# Patient Record
Sex: Male | Born: 2000 | Race: Black or African American | Hispanic: No | Marital: Single | Smoking: Never smoker
Health system: Southern US, Community
[De-identification: ages and names within clinical notes are randomized; demographics above are authoritative.]

---

## 2021-12-06 ENCOUNTER — Emergency Department (HOSPITAL_COMMUNITY): Payer: Federal, State, Local not specified - PPO

## 2021-12-06 ENCOUNTER — Other Ambulatory Visit: Payer: Self-pay

## 2021-12-06 ENCOUNTER — Encounter (HOSPITAL_COMMUNITY): Payer: Self-pay

## 2021-12-06 ENCOUNTER — Emergency Department (HOSPITAL_COMMUNITY)
Admission: EM | Admit: 2021-12-06 | Discharge: 2021-12-07 | Disposition: A | Discharge status: Home / Self Care | Payer: Federal, State, Local not specified - PPO | Attending: Emergency Medicine | Admitting: Emergency Medicine

## 2021-12-06 DIAGNOSIS — R079 Chest pain, unspecified: Secondary | ICD-10-CM

## 2021-12-06 DIAGNOSIS — R001 Bradycardia, unspecified: Secondary | ICD-10-CM | POA: Diagnosis not present

## 2021-12-06 DIAGNOSIS — R0789 Other chest pain: Secondary | ICD-10-CM | POA: Diagnosis not present

## 2021-12-06 DIAGNOSIS — R1013 Epigastric pain: Secondary | ICD-10-CM | POA: Insufficient documentation

## 2021-12-06 LAB — CBC WITH DIFFERENTIAL/PLATELET
Abs Immature Granulocytes: 0.02 10*3/uL (ref 0.00–0.07)
Basophils Absolute: 0 10*3/uL (ref 0.0–0.1)
Basophils Relative: 1 %
Eosinophils Absolute: 0.2 10*3/uL (ref 0.0–0.5)
Eosinophils Relative: 2 %
HCT: 35.8 % — ABNORMAL LOW (ref 39.0–52.0)
Hemoglobin: 12 g/dL — ABNORMAL LOW (ref 13.0–17.0)
Immature Granulocytes: 0 %
Lymphocytes Relative: 21 %
Lymphs Abs: 1.8 10*3/uL (ref 0.7–4.0)
MCH: 29.9 pg (ref 26.0–34.0)
MCHC: 33.5 g/dL (ref 30.0–36.0)
MCV: 89.3 fL (ref 80.0–100.0)
Monocytes Absolute: 0.7 10*3/uL (ref 0.1–1.0)
Monocytes Relative: 8 %
Neutro Abs: 6.1 10*3/uL (ref 1.7–7.7)
Neutrophils Relative %: 68 %
Platelets: 240 10*3/uL (ref 150–400)
RBC: 4.01 MIL/uL — ABNORMAL LOW (ref 4.22–5.81)
RDW: 13 % (ref 11.5–15.5)
WBC: 8.8 10*3/uL (ref 4.0–10.5)
nRBC: 0 % (ref 0.0–0.2)

## 2021-12-06 MED ORDER — ALUM & MAG HYDROXIDE-SIMETH 200-200-20 MG/5ML PO SUSP
30.0000 mL | Freq: Once | ORAL | Status: AC
  Administered 2021-12-06: 30 mL via ORAL
  Filled 2021-12-06: qty 30

## 2021-12-06 MED ORDER — LIDOCAINE VISCOUS HCL 2 % MT SOLN
15.0000 mL | Freq: Once | OROMUCOSAL | Status: AC
  Administered 2021-12-06: 15 mL via ORAL
  Filled 2021-12-06: qty 15

## 2021-12-06 NOTE — ED Triage Notes (Signed)
Left chest pain that radiates to back and left shoulder. Also describes a sharp stabbing pain when taking a deep breath. Denies injury. Pain began while eating dinner.  ?

## 2021-12-06 NOTE — ED Provider Notes (Signed)
?[facility]-EMERGENCY DEPT ?Provider Note ? ? ?CSN: [id number] ?Arrival date & time: 12/06/21  2214 ? ?  ? ?History ? ?Chief Complaint  ?Patient presents with  ? Chest Pain  ? ? ?Kevin Cabrera is a 21 y.o. male without significant pmhx who presents to the ED with complaints of chest pain that started 1 hour PTA. Patient reports that he was sitting down watching TV and started to note pain to this left/central chest, sharp in nature, worse when supine, no alleviating factors. Denies nausea, vomiting, dyspnea, syncope, abdominal pain, leg pain/swelling, hemoptysis, recent surgery/trauma, recent long travel, hormone use, personal hx of cancer, or hx of DVT/PE.  ? ? ? ?HPI ? ?  ? ?Home Medications ?Prior to Admission medications   ?Not on File  ?   ? ?Allergies    ?Patient has no known allergies.   ? ?Review of Systems   ?Review of Systems  ?Constitutional:  Negative for chills and fever.  ?Respiratory:  Negative for cough and shortness of breath.   ?Cardiovascular:  Positive for chest pain. Negative for leg swelling.  ?Gastrointestinal:  Negative for abdominal pain, nausea and vomiting.  ?Neurological:  Negative for dizziness, syncope and light-headedness.  ?All other systems reviewed and are negative. ? ?Physical Exam ?Updated Vital Signs ?BP 118/79 (BP Location: Left Arm)   Pulse 65   Temp 98.1 ?F (36.7 ?C) (Oral)   Resp 16   Ht 6\' 1"  (1.854 m)   Wt 70.3 kg   SpO2 100%   BMI 20.45 kg/m?  ?Physical Exam ?Vitals and nursing note reviewed.  ?Constitutional:   ?   General: He is not in acute distress. ?   Appearance: He is well-developed. He is not toxic-appearing.  ?HENT:  ?   Head: Normocephalic and atraumatic.  ?Eyes:  ?   General:     ?   Right eye: No discharge.     ?   Left eye: No discharge.  ?   Conjunctiva/sclera: Conjunctivae normal.  ?Cardiovascular:  ?   Rate and Rhythm: Normal rate and regular rhythm.  ?   Pulses:     ?     Radial pulses are 2+ on the right side and 2+ on the left  side.  ?Pulmonary:  ?   Effort: No respiratory distress.  ?   Breath sounds: Normal breath sounds. No wheezing or rales.  ?Chest:  ?   Chest wall: Tenderness (anterior chest wall TTP centrally and to the left) present.  ?Abdominal:  ?   General: There is no distension.  ?   Palpations: Abdomen is soft.  ?   Tenderness: There is abdominal tenderness in the epigastric area. There is no guarding or rebound. Negative signs include Murphy's sign.  ?Musculoskeletal:  ?   Cervical back: Neck supple.  ?   Right lower leg: No tenderness. No edema.  ?   Left lower leg: No tenderness. No edema.  ?Skin: ?   General: Skin is warm and dry.  ?Neurological:  ?   Mental Status: He is alert.  ?   Comments: Clear speech.   ?Psychiatric:     ?   Behavior: Behavior normal.  ? ? ?ED Results / Procedures / Treatments   ?Labs ?(all labs ordered are listed, but only abnormal results are displayed) ?Labs Reviewed  ?COMPREHENSIVE METABOLIC PANEL - Abnormal; Notable for the following components:  ?    Result Value  ? Glucose, Bld 101 (*)   ? Total Protein  6.4 (*)   ? Anion gap 3 (*)   ? All other components within normal limits  ?CBC WITH DIFFERENTIAL/PLATELET - Abnormal; Notable for the following components:  ? RBC 4.01 (*)   ? Hemoglobin 12.0 (*)   ? HCT 35.8 (*)   ? All other components within normal limits  ?LIPASE, BLOOD  ? ? ?EKG ?None ? ?Radiology ?DG Chest 2 View ? ?Result Date: 12/06/2021 ?CLINICAL DATA:  Left chest pain radiating to back and left shoulder, pain with inspiration EXAM: CHEST - 2 VIEW COMPARISON:  None. FINDINGS: Frontal and lateral views of the chest demonstrate an unremarkable cardiac silhouette. No acute airspace disease, effusion, or pneumothorax. No acute bony abnormality. IMPRESSION: 1. No acute intrathoracic process. Electronically Signed   By: Sharlet SalinaMichael  Brown M.D.   On: 12/06/2021 23:09   ? ?Procedures ?Procedures  ? ? ?Medications Ordered in ED ?Medications  ?alum & mag hydroxide-simeth (MAALOX/MYLANTA)  200-200-20 MG/5ML suspension 30 mL (has no administration in time range)  ?  And  ?lidocaine (XYLOCAINE) 2 % viscous mouth solution 15 mL (has no administration in time range)  ? ? ?ED Course/ Medical Decision Making/ A&P ?  ?                        ?Medical Decision Making ?Amount and/or Complexity of Data Reviewed ?Labs: ordered. ?Radiology: ordered. ? ?Risk ?OTC drugs. ?Prescription drug management. ? ?Patient presents to the emergency department with chest pain. Patient nontoxic appearing, in no apparent distress, vitals without significant abnormality, mild intermittent bradycardia.  Chest wall and mild epigastric tenderness to palpation.  No peritoneal signs. ? ?DDX including but not limited to: ACS, pulmonary embolism, dissection, pneumothorax, pneumonia, arrhythmia, severe anemia, MSK, GERD, anxiety, abdominal process.  ? ?Additional history obtained:  ?Chart & nursing note reviewed.  ? ?EKG: Sinus bradycardia., no STEMI ? ?Lab Tests:  ?I reviewed & interpreted labs including:  ?CBC: Mild anemia, PCP recheck ?CMP: No critical electrolyte derangement.  LFTs within normal limits. ?Lipase: Within normal limits ? ?Imaging Studies ordered:  ?I ordered and viewed the following imaging, agree with radiologist impression:  1. No acute intrathoracic process.  ? ?ED Course:  ?I ordered medications including Gi cocktail & pepcid w/ improvement in sxs. EKG without acute ischemia, no significant CAD risk fractors, doubt ACS. Patient is low risk wells, PERC negative, doubt pulmonary embolism. Pain is not a tearing sensation, symmetric pulses, no widening of mediastinum on CXR, doubt dissection.  Repeat abdominal exam remains without peritoneal signs, I have a low suspicion for acute surgical process such as cholecystitis, perforation, or obstruction.  Given improvement with GI cocktail and Pepcid will provide prescriptions for Protonix and Carafate to go home with.  May have been GERD/PUD etiology of pain, however remains  possible musculoskeletal component with chest wall palpation. Supportive care @ home with close PCP Follow up. I discussed results, treatment plan, need for follow-up, and return precautions with the patient. Provided opportunity for questions, patient confirmed understanding and is in agreement with plan.  ? ? ?Portions of this note were generated with Scientist, clinical (histocompatibility and immunogenetics)Dragon dictation software. Dictation errors may occur despite best attempts at proofreading. ? ?Final Clinical Impression(s) / ED Diagnoses ?Final diagnoses:  ?Chest pain, unspecified type  ? ? ?Rx / DC Orders ?ED Discharge Orders   ? ?      Ordered  ?  pantoprazole (PROTONIX) 40 MG tablet  Daily       ? 12/07/21 0146  ?  sucralfate (CARAFATE) 1 GM/10ML suspension  3 times daily with meals & bedtime       ? 12/07/21 0146  ? ?  ?  ? ?  ? ? ?  ?Cherly Anderson, [place] ?12/07/21 3335 ? ?  ?Tilden Fossa, MD ?12/07/21 [phone] ? ?

## 2021-12-07 LAB — COMPREHENSIVE METABOLIC PANEL
ALT: 15 U/L (ref 0–44)
AST: 19 U/L (ref 15–41)
Albumin: 4 g/dL (ref 3.5–5.0)
Alkaline Phosphatase: 61 U/L (ref 38–126)
Anion gap: 3 — ABNORMAL LOW (ref 5–15)
BUN: 9 mg/dL (ref 6–20)
CO2: 28 mmol/L (ref 22–32)
Calcium: 9.1 mg/dL (ref 8.9–10.3)
Chloride: 107 mmol/L (ref 98–111)
Creatinine, Ser: 0.89 mg/dL (ref 0.61–1.24)
GFR, Estimated: >60 mL/min (ref >60)
Glucose, Bld: 101 mg/dL — ABNORMAL HIGH (ref 70–99)
Potassium: 4 mmol/L (ref 3.5–5.1)
Sodium: 138 mmol/L (ref 135–145)
Total Bilirubin: 0.6 mg/dL (ref 0.3–1.2)
Total Protein: 6.4 g/dL — ABNORMAL LOW (ref 6.5–8.1)

## 2021-12-07 LAB — LIPASE, BLOOD: Lipase: 34 U/L (ref 11–51)

## 2021-12-07 MED ORDER — SUCRALFATE 1 GM/10ML PO SUSP: 1.0000 g | Freq: Three times a day (TID) | ORAL | 0 refills | Status: AC

## 2021-12-07 MED ORDER — FAMOTIDINE 20 MG PO TABS
20.0000 mg | ORAL_TABLET | Freq: Once | ORAL | Status: AC
  Administered 2021-12-07: 20 mg via ORAL
  Filled 2021-12-07: qty 1

## 2021-12-07 MED ORDER — PANTOPRAZOLE SODIUM 40 MG PO TBEC: 40.0000 mg | DELAYED_RELEASE_TABLET | Freq: Every day | ORAL | 0 refills | Status: AC

## 2021-12-07 NOTE — Discharge Instructions (Addendum)
You were seen in the emergency department tonight for chest pain.  Your chest x-ray was normal.  Your labs show that your hemoglobin was mildly low please have this rechecked by your primary care provider.  Your EKG was reassuring.  We are sending you home with the following medications to help with your symptoms:  ?- Protonix- please take 1 tablet in the morning prior to any meals to help with stomach acidity/pain.  ?- Carafate- please take prior to each meal and prior to bedtime to help with stomach acidity/pain.  ? ?You may take Tylenol per over-the-counter dosing to help with pain as well. ?We have prescribed you new medication(s) today. Discuss the medications prescribed today with your pharmacist as they can have adverse effects and interactions with your other medicines including over the counter and prescribed medications. Seek medical evaluation if you start to experience new or abnormal symptoms after taking one of these medicines, seek care immediately if you start to experience difficulty breathing, feeling of your throat closing, facial swelling, or rash as these could be indications of a more serious allergic reaction ? ?Please follow attached diet guidelines.  ? ?Follow up with your primary care provider within 3 days for re-evaluation.  ?Return to the ER for new or worsening symptoms including but not limited to worsened pain, new pain, inability to keep fluids down, blood in vomit/stool, passing out, trouble breathing, or any other concerns.   ?

## 2023-04-30 IMAGING — CR DG CHEST 2V
2 series · 2 of 2 positions shown · non-contrast
Comparison: None.

CLINICAL DATA: Left chest pain radiating to back and left shoulder,
pain with inspiration

EXAM:
CHEST - 2 VIEW

[w chest pa]
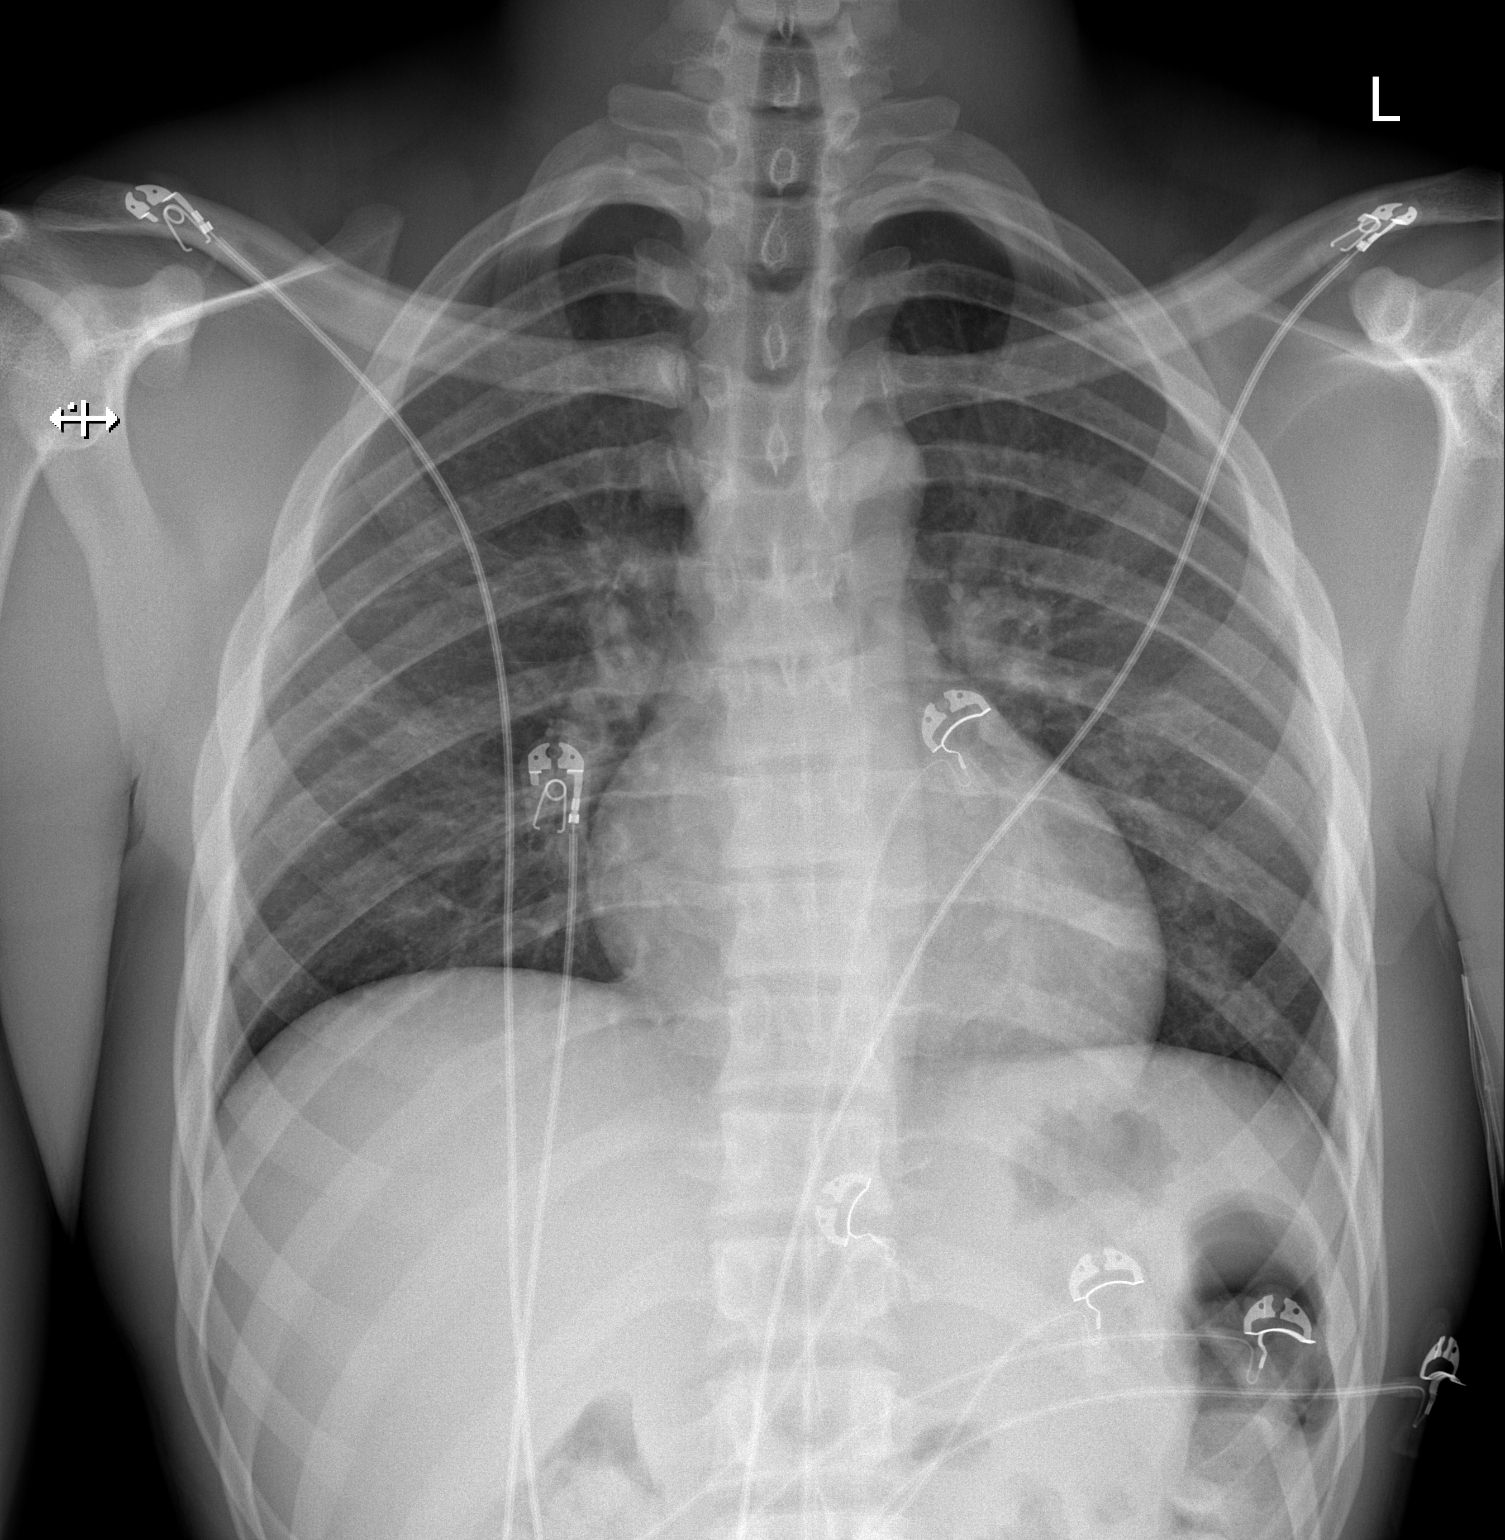

[w chest lat]
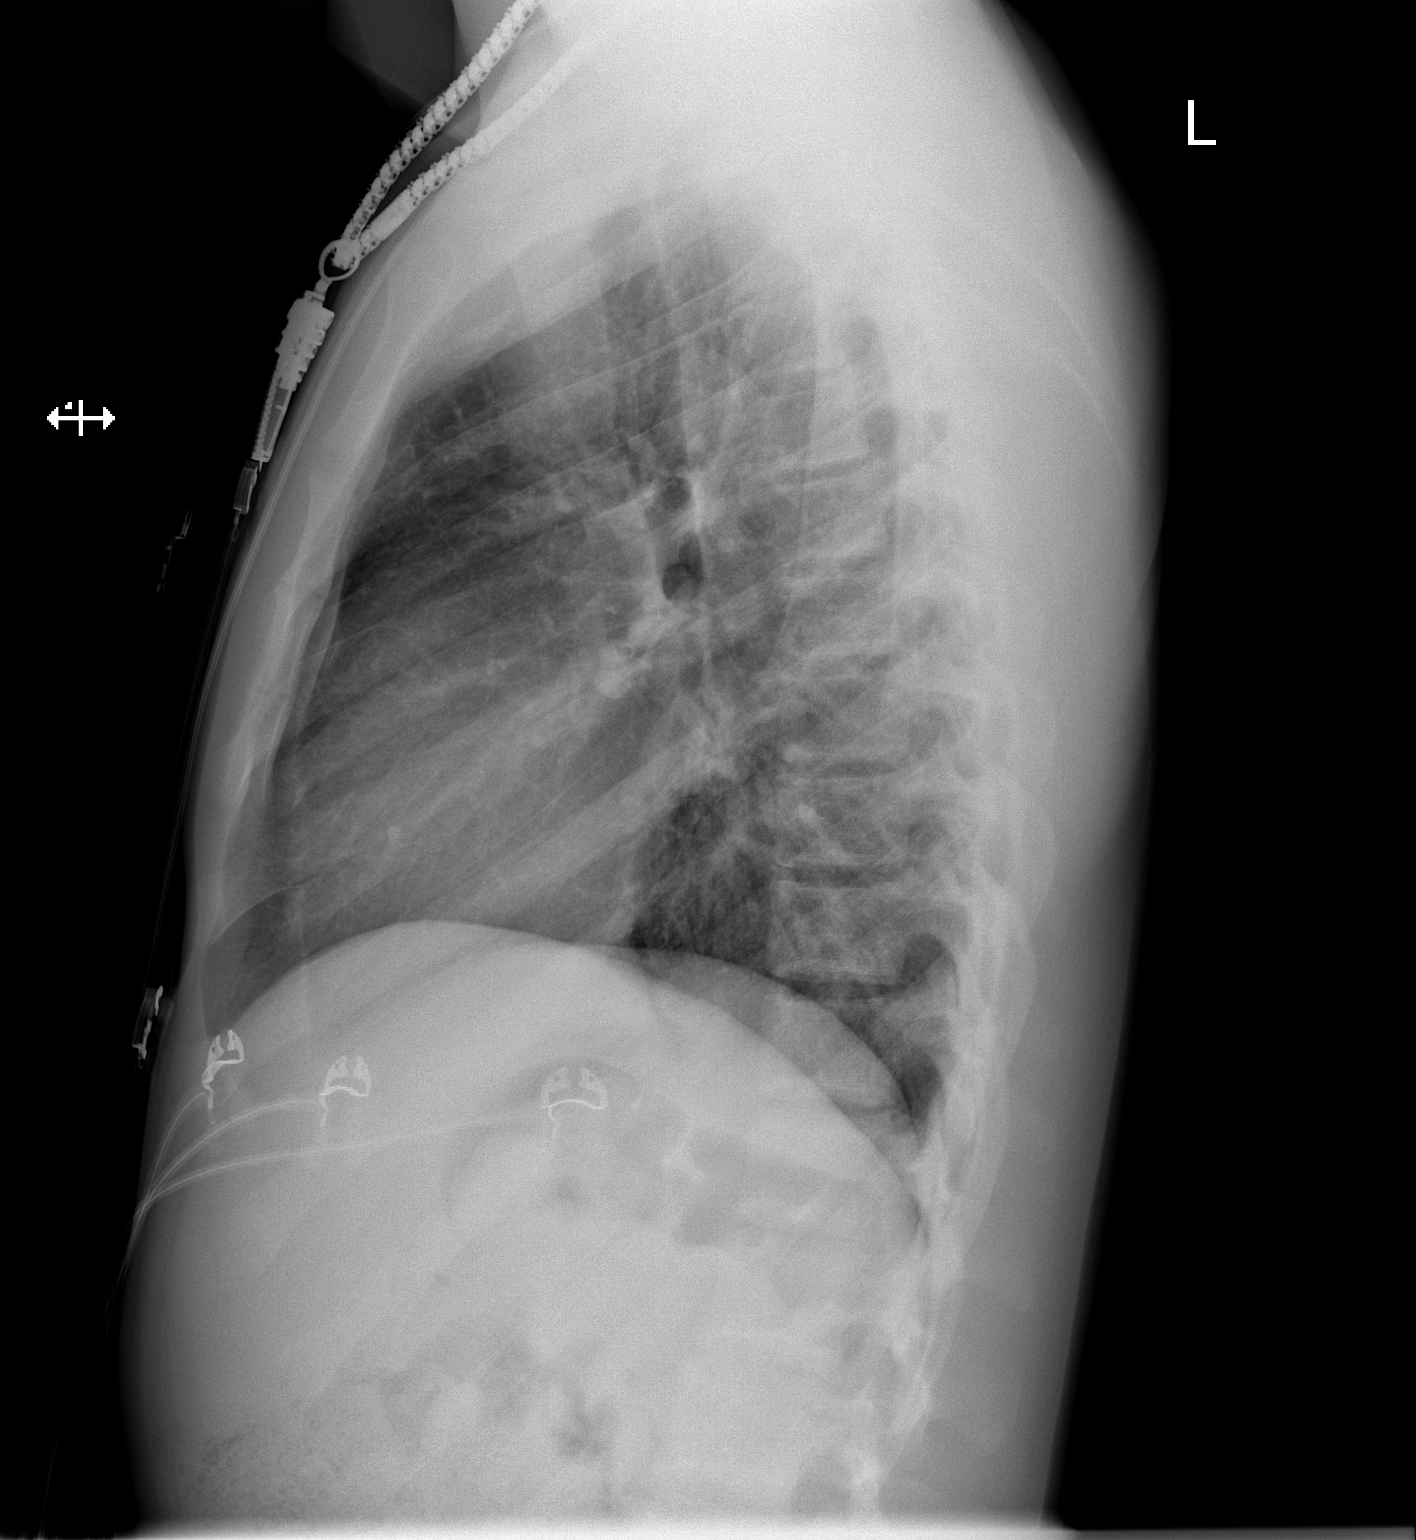

[2 of 2 positions shown; findings below may reference images not displayed]

FINDINGS: Frontal and lateral views of the chest demonstrate an unremarkable
cardiac silhouette. No acute airspace disease, effusion, or
pneumothorax. No acute bony abnormality.
IMPRESSION: 1. No acute intrathoracic process.
